# Patient Record
Sex: Female | Born: 1955
Health system: Southern US, Community
[De-identification: ages and names within clinical notes are randomized; demographics above are authoritative.]

---

## 2010-02-02 ENCOUNTER — Ambulatory Visit: Payer: Self-pay | Admitting: Radiology

## 2010-02-02 ENCOUNTER — Ambulatory Visit (HOSPITAL_BASED_OUTPATIENT_CLINIC_OR_DEPARTMENT_OTHER): Admission: RE | Admit: 2010-02-02 | Discharge: 2010-02-02 | Payer: Self-pay | Admitting: Obstetrics and Gynecology

## 2011-02-03 ENCOUNTER — Other Ambulatory Visit (HOSPITAL_BASED_OUTPATIENT_CLINIC_OR_DEPARTMENT_OTHER): Payer: Self-pay | Admitting: *Deleted

## 2011-02-03 DIAGNOSIS — Z1231 Encounter for screening mammogram for malignant neoplasm of breast: Secondary | ICD-10-CM

## 2011-03-08 ENCOUNTER — Ambulatory Visit (HOSPITAL_BASED_OUTPATIENT_CLINIC_OR_DEPARTMENT_OTHER)
Admission: RE | Admit: 2011-03-08 | Discharge: 2011-03-08 | Disposition: A | Discharge status: Home / Self Care | Payer: BC Managed Care – PPO | Source: Ambulatory Visit | Attending: Obstetrics and Gynecology | Admitting: Obstetrics and Gynecology

## 2011-03-08 DIAGNOSIS — Z1231 Encounter for screening mammogram for malignant neoplasm of breast: Secondary | ICD-10-CM | POA: Insufficient documentation

## 2012-06-02 ENCOUNTER — Other Ambulatory Visit (HOSPITAL_BASED_OUTPATIENT_CLINIC_OR_DEPARTMENT_OTHER): Payer: Self-pay | Admitting: Obstetrics and Gynecology

## 2012-06-02 DIAGNOSIS — Z1231 Encounter for screening mammogram for malignant neoplasm of breast: Secondary | ICD-10-CM

## 2012-06-12 ENCOUNTER — Inpatient Hospital Stay (HOSPITAL_BASED_OUTPATIENT_CLINIC_OR_DEPARTMENT_OTHER): Admission: RE | Admit: 2012-06-12 | Payer: BC Managed Care – PPO | Source: Ambulatory Visit

## 2012-06-20 ENCOUNTER — Ambulatory Visit (HOSPITAL_BASED_OUTPATIENT_CLINIC_OR_DEPARTMENT_OTHER)
Admission: RE | Admit: 2012-06-20 | Discharge: 2012-06-20 | Disposition: A | Discharge status: Home / Self Care | Payer: BC Managed Care – PPO | Source: Ambulatory Visit | Attending: Obstetrics and Gynecology | Admitting: Obstetrics and Gynecology

## 2012-06-20 DIAGNOSIS — Z1231 Encounter for screening mammogram for malignant neoplasm of breast: Secondary | ICD-10-CM | POA: Insufficient documentation

## 2014-01-29 ENCOUNTER — Other Ambulatory Visit (HOSPITAL_BASED_OUTPATIENT_CLINIC_OR_DEPARTMENT_OTHER): Payer: Self-pay | Admitting: Obstetrics and Gynecology

## 2014-01-29 DIAGNOSIS — Z1231 Encounter for screening mammogram for malignant neoplasm of breast: Secondary | ICD-10-CM

## 2014-02-25 ENCOUNTER — Ambulatory Visit (HOSPITAL_BASED_OUTPATIENT_CLINIC_OR_DEPARTMENT_OTHER)
Admission: RE | Admit: 2014-02-25 | Discharge: 2014-02-25 | Disposition: A | Discharge status: Home / Self Care | Payer: BC Managed Care – PPO | Source: Ambulatory Visit | Attending: Obstetrics and Gynecology | Admitting: Obstetrics and Gynecology

## 2014-02-25 DIAGNOSIS — Z1231 Encounter for screening mammogram for malignant neoplasm of breast: Secondary | ICD-10-CM | POA: Insufficient documentation

## 2015-03-04 ENCOUNTER — Other Ambulatory Visit (HOSPITAL_BASED_OUTPATIENT_CLINIC_OR_DEPARTMENT_OTHER): Payer: Self-pay | Admitting: Obstetrics and Gynecology

## 2015-03-04 DIAGNOSIS — Z1231 Encounter for screening mammogram for malignant neoplasm of breast: Secondary | ICD-10-CM

## 2015-04-01 ENCOUNTER — Ambulatory Visit (HOSPITAL_BASED_OUTPATIENT_CLINIC_OR_DEPARTMENT_OTHER): Payer: Self-pay

## 2015-04-07 ENCOUNTER — Ambulatory Visit (HOSPITAL_BASED_OUTPATIENT_CLINIC_OR_DEPARTMENT_OTHER)
Admission: RE | Admit: 2015-04-07 | Discharge: 2015-04-07 | Disposition: A | Discharge status: Home / Self Care | Payer: BLUE CROSS/BLUE SHIELD | Source: Ambulatory Visit | Attending: Obstetrics and Gynecology | Admitting: Obstetrics and Gynecology

## 2015-04-07 DIAGNOSIS — Z1231 Encounter for screening mammogram for malignant neoplasm of breast: Secondary | ICD-10-CM

## 2016-04-16 IMAGING — MG MM DIGITAL SCREENING BILAT W/ TOMO W/ CAD
12 series · 12 of 28 positions shown · non-contrast
Comparison: Previous exam(s).

CLINICAL DATA: Screening.

EXAM:
DIGITAL SCREENING BILATERAL MAMMOGRAM WITH 3D TOMO WITH CAD

[L CC]
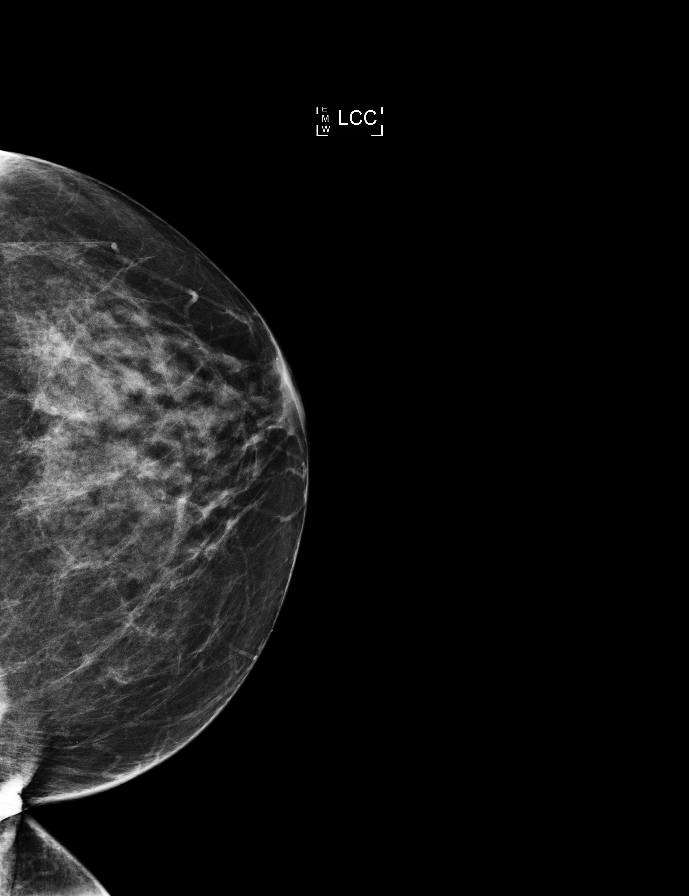

[L MLO]
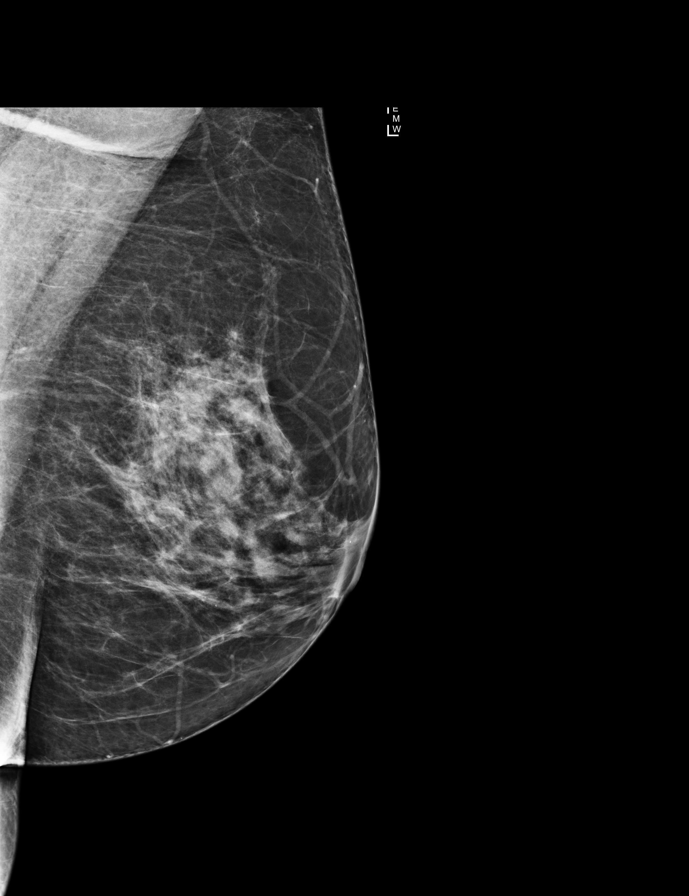

[R CC]
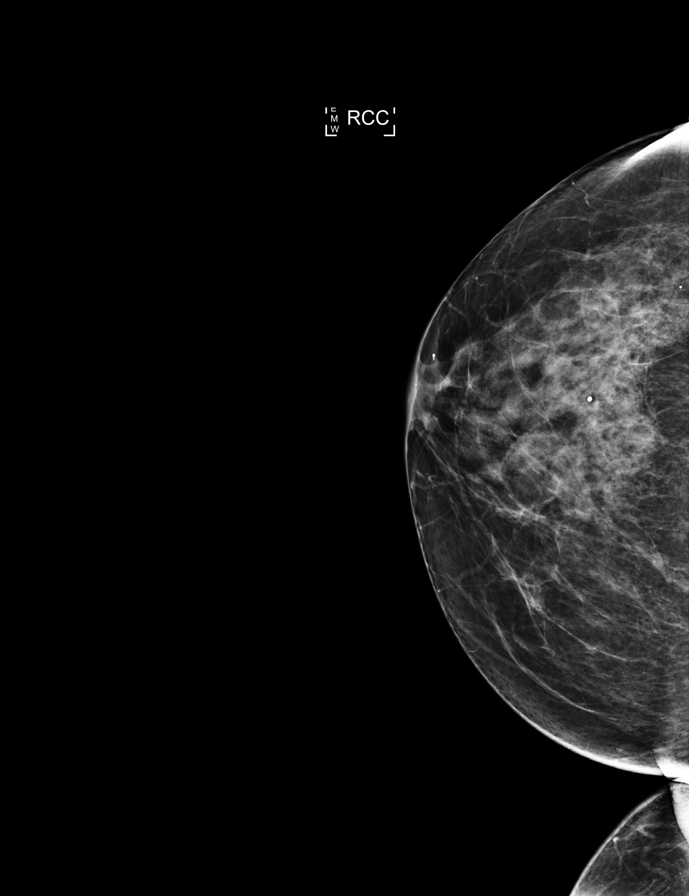

[R MLO]
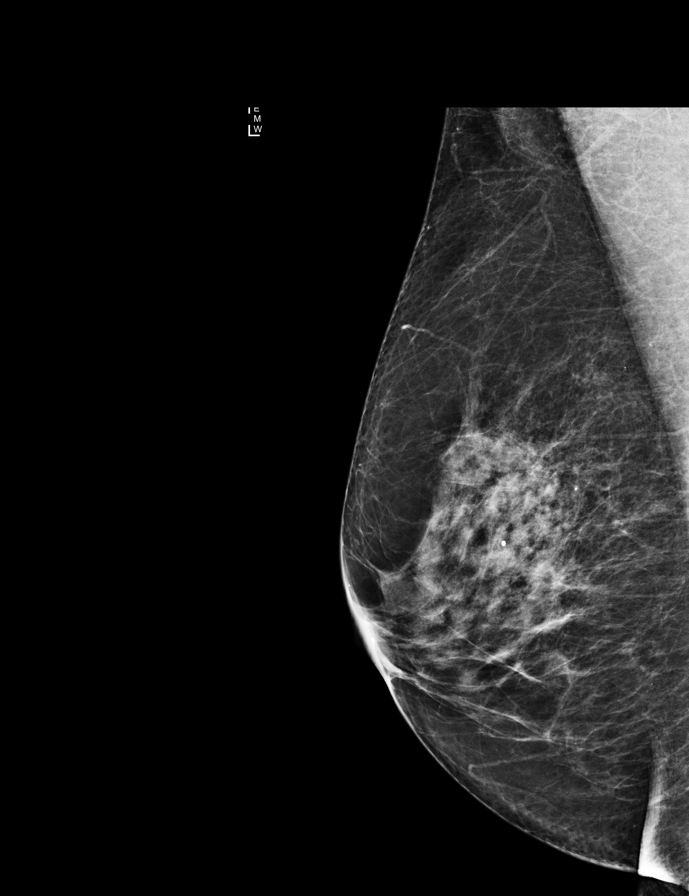

[L MLO tomo (1 of 2)]
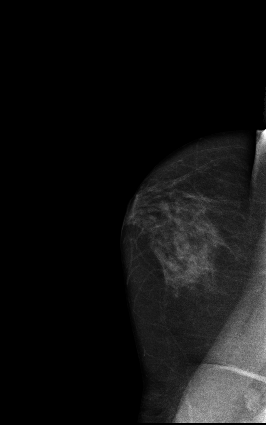

[L CC tomo (1 of 2)]
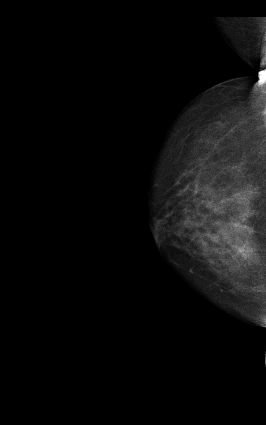

[R MLO tomo (1 of 2)]
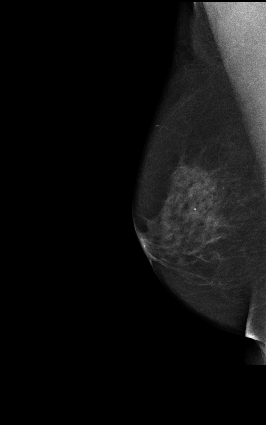

[R CC tomo (1 of 2)]
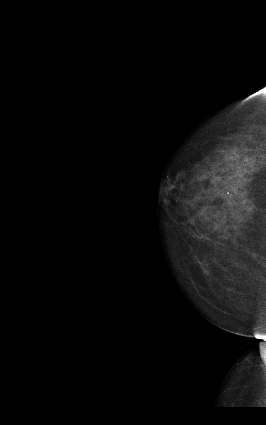

[R CC tomo (2 of 2) · tomo slice 34/67.0]
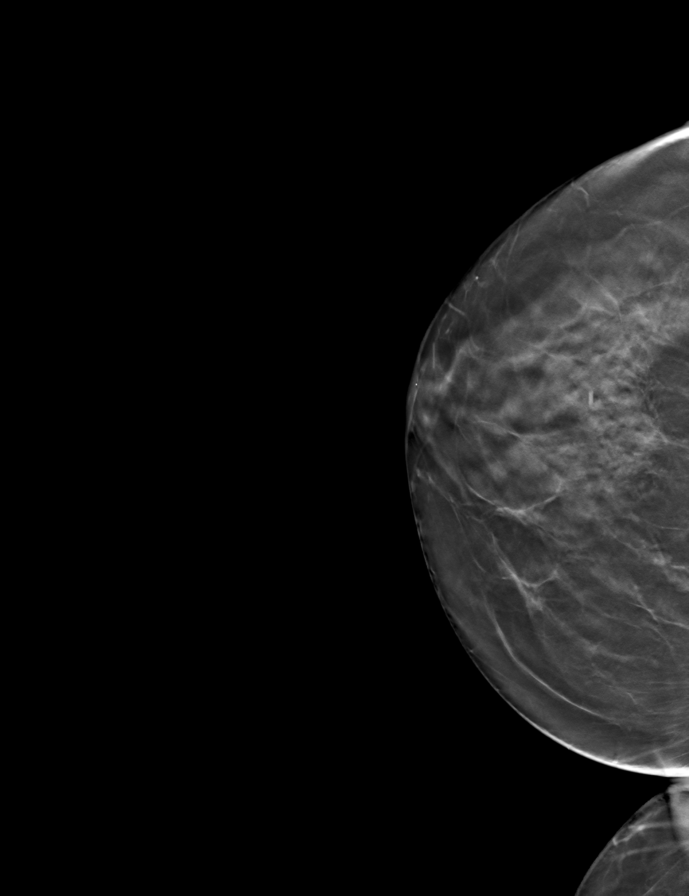

[L CC tomo (2 of 2) · tomo slice 35/69.0]
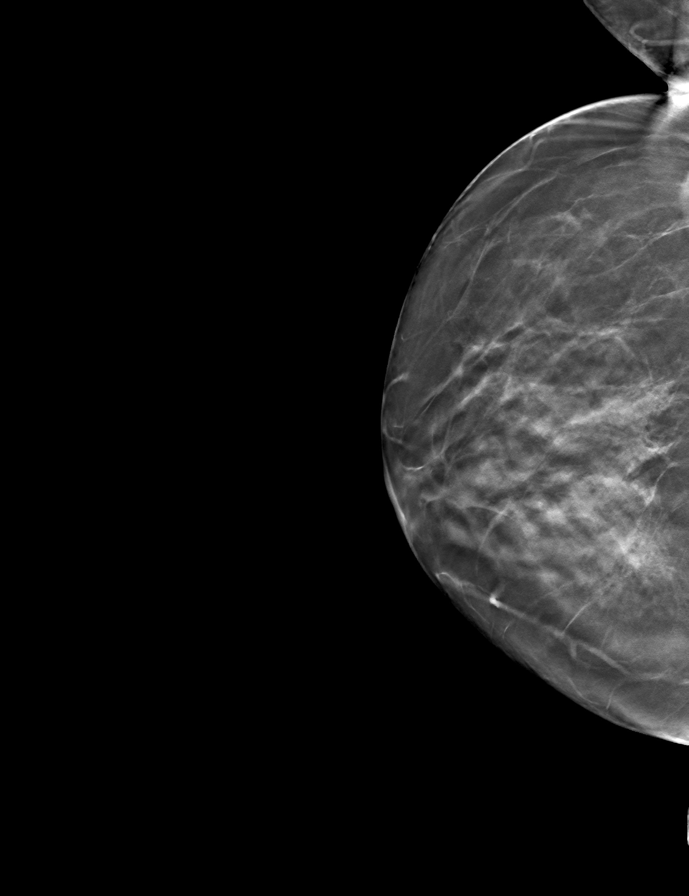

[L MLO tomo (2 of 2) · tomo slice 35/70.0]
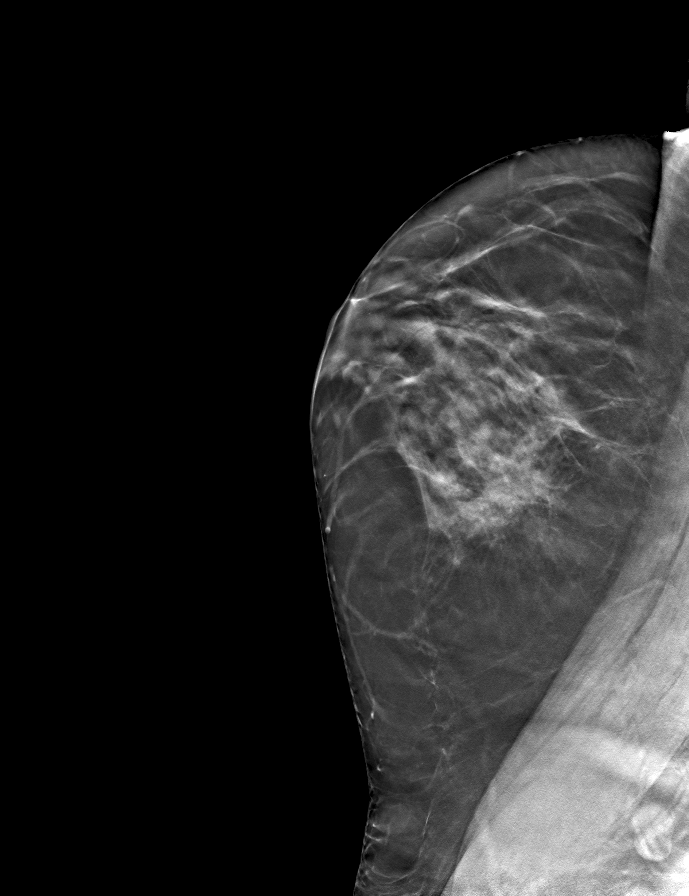

[R MLO tomo (2 of 2) · tomo slice 32/63.0]
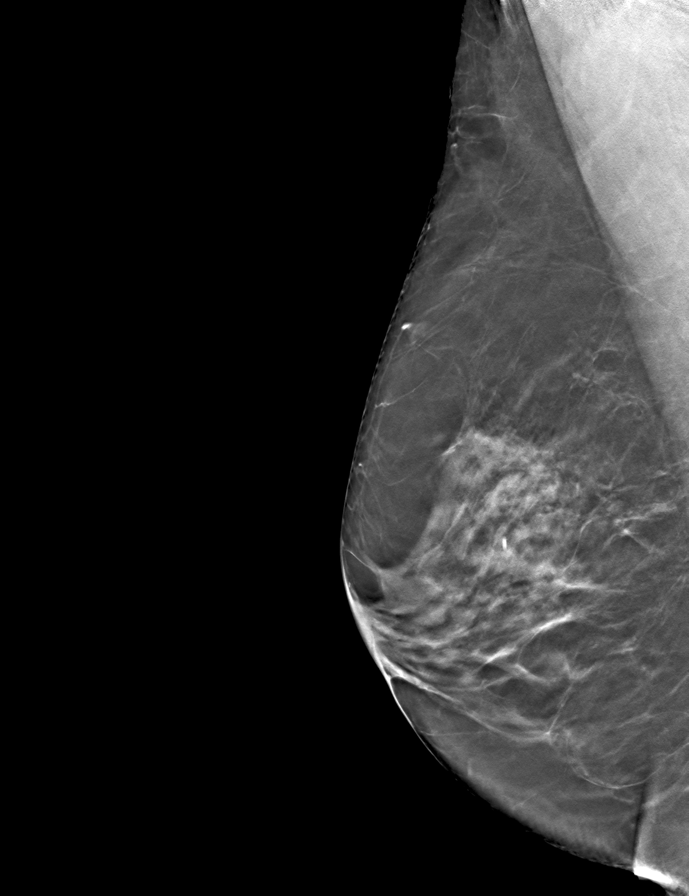

[12 of 28 positions shown; findings below may reference images not displayed]

ACR Breast Density Category c: The breast tissue is heterogeneously
dense, which may obscure small masses.
FINDINGS: There are no findings suspicious for malignancy. Images were
processed with CAD.
IMPRESSION: No mammographic evidence of malignancy. A result letter of this
screening mammogram will be mailed directly to the patient.

RECOMMENDATION:
Screening mammogram in one year. (Code:OA-G-1SS)

BI-RADS CATEGORY  1: Negative.

## 2016-05-10 ENCOUNTER — Other Ambulatory Visit (HOSPITAL_BASED_OUTPATIENT_CLINIC_OR_DEPARTMENT_OTHER): Payer: Self-pay | Admitting: Obstetrics and Gynecology

## 2016-05-10 DIAGNOSIS — Z1231 Encounter for screening mammogram for malignant neoplasm of breast: Secondary | ICD-10-CM

## 2016-05-24 ENCOUNTER — Ambulatory Visit (HOSPITAL_BASED_OUTPATIENT_CLINIC_OR_DEPARTMENT_OTHER)
Admission: RE | Admit: 2016-05-24 | Discharge: 2016-05-24 | Disposition: A | Discharge status: Home / Self Care | Payer: BLUE CROSS/BLUE SHIELD | Source: Ambulatory Visit | Attending: Obstetrics and Gynecology | Admitting: Obstetrics and Gynecology

## 2016-05-24 DIAGNOSIS — Z1231 Encounter for screening mammogram for malignant neoplasm of breast: Secondary | ICD-10-CM | POA: Diagnosis not present

## 2016-05-26 DIAGNOSIS — G4700 Insomnia, unspecified: Secondary | ICD-10-CM | POA: Diagnosis not present

## 2016-05-26 DIAGNOSIS — Z6822 Body mass index (BMI) 22.0-22.9, adult: Secondary | ICD-10-CM | POA: Diagnosis not present

## 2016-05-26 DIAGNOSIS — Z01419 Encounter for gynecological examination (general) (routine) without abnormal findings: Secondary | ICD-10-CM | POA: Diagnosis not present

## 2018-01-24 DIAGNOSIS — M5416 Radiculopathy, lumbar region: Secondary | ICD-10-CM | POA: Diagnosis not present

## 2018-01-30 DIAGNOSIS — M5442 Lumbago with sciatica, left side: Secondary | ICD-10-CM | POA: Diagnosis not present

## 2018-01-30 DIAGNOSIS — M62830 Muscle spasm of back: Secondary | ICD-10-CM | POA: Diagnosis not present

## 2018-02-06 ENCOUNTER — Ambulatory Visit (INDEPENDENT_AMBULATORY_CARE_PROVIDER_SITE_OTHER): Payer: BLUE CROSS/BLUE SHIELD | Admitting: Family Medicine

## 2018-02-06 ENCOUNTER — Encounter: Payer: Self-pay | Admitting: Family Medicine

## 2018-02-06 VITALS — BP 175/100 | HR 89 | Ht 64.0 in | Wt 135.0 lb

## 2018-02-06 DIAGNOSIS — M5416 Radiculopathy, lumbar region: Secondary | ICD-10-CM

## 2018-02-06 MED ORDER — TIZANIDINE HCL 4 MG PO TABS: 4.0000 mg | ORAL_TABLET | Freq: Every evening | ORAL | 1 refills | Status: AC | PRN

## 2018-02-06 NOTE — Patient Instructions (Signed)
You have lumbar radiculopathy (a pinched nerve in your low back from a bulging or herniated disc). Aleve 2 tabs twice a day with food for pain and inflammation. Tizanidine as needed for muscle spasms (no driving on this medicine if it makes you sleepy). Stay as active as possible. Physical therapy has been shown to be helpful as well - continue working with [place], transition to home exercises as you improve. Strengthening of low back muscles, abdominal musculature are key for long term pain relief. Follow up with me in 1 month or as needed. Consider imaging (MRI), repeat prednisone dose pack if not continuing to improve. Wait 2-3 weeks (when pain is less than 3/10 at all times) before returning to golf - do this every other day and advance as we discussed.

## 2018-02-06 NOTE — Progress Notes (Signed)
PCP: System, Pcp Not In  Subjective:   HPI: Patient is a 62 y.o. female here for left low back pain with radiculopathy.  Patient states she injured her pain 2 weeks ago while playing golf.  She was able to finish playing.  Initially she had back pain and later that night she developed radiation of the pain into her anterior lateral left thigh.  She was seen by an orthopedic clinic in prescribed a course of prednisone, tizanidine, and meloxicam.  Her back pain has since resolved but she continues to have pain in the anterior lateral thigh in the L4 distribution.  This is generally 3/10 but can be 7/10 and sharp at night.  She has not noticed anything specifically that worsens this pain.  She recently went to the gym where she did some light strength training and a spin class and denies any worsening of her pain.  She denies any red flag symptoms.  She denies any numbness or weakness in her lower extremities.  She denies any groin pain or knee pain.  History reviewed. No pertinent past medical history.  No current outpatient medications on file prior to visit.   No current facility-administered medications on file prior to visit.     History reviewed. No pertinent surgical history.  No Known Allergies  Social History   Socioeconomic History  . Marital status: Married    Spouse name: Not on file  . Number of children: Not on file  . Years of education: Not on file  . Highest education level: Not on file  Occupational History  . Not on file  Social Needs  . Financial resource strain: Not on file  . Food insecurity:    Worry: Not on file    Inability: Not on file  . Transportation needs:    Medical: Not on file    Non-medical: Not on file  Tobacco Use  . Smoking status: Never Smoker  . Smokeless tobacco: Never Used  Substance and Sexual Activity  . Alcohol use: Not on file  . Drug use: Not on file  . Sexual activity: Not on file  Lifestyle  . Physical activity:    Days per  week: Not on file    Minutes per session: Not on file  . Stress: Not on file  Relationships  . Social connections:    Talks on phone: Not on file    Gets together: Not on file    Attends religious service: Not on file    Active member of club or organization: Not on file    Attends meetings of clubs or organizations: Not on file    Relationship status: Not on file  . Intimate partner violence:    Fear of current or ex partner: Not on file    Emotionally abused: Not on file    Physically abused: Not on file    Forced sexual activity: Not on file  Other Topics Concern  . Not on file  Social History Narrative  . Not on file    History reviewed. No pertinent family history.  BP (!) 175/100   Pulse 89   Ht 5\' 4"  (1.626 m)   Wt 135 lb (61.2 kg)   BMI 23.17 kg/m   Review of Systems: See HPI above.     Objective:  Physical Exam:  Gen: awake, alert, NAD, comfortable in exam room Pulm: breathing unlabored  Lumbar spine: - Inspection: no gross deformity or asymmetry, swelling or ecchymosis - Palpation: No TTP  over the spinous processes, paraspinal muscles.   - ROM: full active ROM of the lumbar spine in flexion and extension without pain - Strength: 5/5 strength of lower extremity in L4-S1 nerve root distributions b/l - Neuro: sensation intact in the L4-S1 nerve root distribution b/l, 2+ L4 and S1 reflexes -Negative straight leg raise bilaterally  Left hip:  - Inspection: No gross deformity, no swelling, erythema, or ecchymosis - Palpation: No tenderness over the greater trochanter or IT band - ROM: Normal range of motion on Flexion, extension, abduction, internal and external rotation - Strength: Normal strength. - Neuro/vasc: NV intact distally   Assessment & Plan:  1.  Lumbar radiculopathy in the left L4 distribution- at this point her back pain is largely resolved but she seems to have continued irritation of the left L4 nerve root.  No focal weakness or red flag  symptoms on exam. - Continue physical therapy - Aleve 2 tablets twice daily as needed for pain - We will refill tizanidine to take at night for muscle spasm - Follow-up in 1 month.  Consider MRI or repeat prednisone Dosepak if not improving

## 2018-02-07 ENCOUNTER — Encounter: Payer: Self-pay | Admitting: Family Medicine

## 2018-02-13 DIAGNOSIS — M5417 Radiculopathy, lumbosacral region: Secondary | ICD-10-CM | POA: Diagnosis not present

## 2018-02-13 DIAGNOSIS — M6281 Muscle weakness (generalized): Secondary | ICD-10-CM | POA: Diagnosis not present

## 2018-02-20 DIAGNOSIS — M6281 Muscle weakness (generalized): Secondary | ICD-10-CM | POA: Diagnosis not present

## 2018-02-20 DIAGNOSIS — M5417 Radiculopathy, lumbosacral region: Secondary | ICD-10-CM | POA: Diagnosis not present

## 2018-02-27 DIAGNOSIS — M5417 Radiculopathy, lumbosacral region: Secondary | ICD-10-CM | POA: Diagnosis not present

## 2018-02-27 DIAGNOSIS — M6281 Muscle weakness (generalized): Secondary | ICD-10-CM | POA: Diagnosis not present

## 2018-06-12 ENCOUNTER — Other Ambulatory Visit (HOSPITAL_BASED_OUTPATIENT_CLINIC_OR_DEPARTMENT_OTHER): Payer: Self-pay | Admitting: Obstetrics and Gynecology

## 2018-06-12 DIAGNOSIS — Z1231 Encounter for screening mammogram for malignant neoplasm of breast: Secondary | ICD-10-CM

## 2018-07-18 ENCOUNTER — Ambulatory Visit (HOSPITAL_BASED_OUTPATIENT_CLINIC_OR_DEPARTMENT_OTHER): Payer: BLUE CROSS/BLUE SHIELD

## 2018-08-22 ENCOUNTER — Ambulatory Visit (HOSPITAL_BASED_OUTPATIENT_CLINIC_OR_DEPARTMENT_OTHER): Payer: BLUE CROSS/BLUE SHIELD

## 2021-11-17 ENCOUNTER — Other Ambulatory Visit (HOSPITAL_BASED_OUTPATIENT_CLINIC_OR_DEPARTMENT_OTHER): Payer: Self-pay | Admitting: Obstetrics and Gynecology

## 2021-11-17 DIAGNOSIS — Z1231 Encounter for screening mammogram for malignant neoplasm of breast: Secondary | ICD-10-CM

## 2022-01-12 ENCOUNTER — Ambulatory Visit (HOSPITAL_BASED_OUTPATIENT_CLINIC_OR_DEPARTMENT_OTHER): Payer: BLUE CROSS/BLUE SHIELD

## 2022-02-16 ENCOUNTER — Ambulatory Visit (HOSPITAL_BASED_OUTPATIENT_CLINIC_OR_DEPARTMENT_OTHER)
Admission: RE | Admit: 2022-02-16 | Discharge: 2022-02-16 | Disposition: A | Discharge status: Home / Self Care | Payer: BC Managed Care – PPO | Source: Ambulatory Visit | Attending: Obstetrics and Gynecology | Admitting: Obstetrics and Gynecology

## 2022-02-16 ENCOUNTER — Encounter (HOSPITAL_BASED_OUTPATIENT_CLINIC_OR_DEPARTMENT_OTHER): Payer: Self-pay

## 2022-02-16 DIAGNOSIS — Z1231 Encounter for screening mammogram for malignant neoplasm of breast: Secondary | ICD-10-CM | POA: Insufficient documentation
